# Patient Record
Sex: Female | Born: 1945 | Race: White | Hispanic: No | Marital: Married | Smoking: Current every day smoker
Health system: Southern US, Community
[De-identification: ages and names within clinical notes are randomized; demographics above are authoritative.]

## PROBLEM LIST (undated history)

## (undated) DIAGNOSIS — Z973 Presence of spectacles and contact lenses: Secondary | ICD-10-CM

## (undated) DIAGNOSIS — I10 Essential (primary) hypertension: Secondary | ICD-10-CM

## (undated) DIAGNOSIS — F419 Anxiety disorder, unspecified: Secondary | ICD-10-CM

## (undated) DIAGNOSIS — C801 Malignant (primary) neoplasm, unspecified: Secondary | ICD-10-CM

## (undated) DIAGNOSIS — K219 Gastro-esophageal reflux disease without esophagitis: Secondary | ICD-10-CM

## (undated) DIAGNOSIS — M1990 Unspecified osteoarthritis, unspecified site: Secondary | ICD-10-CM

## (undated) HISTORY — PX: COLONOSCOPY: SHX174

## (undated) HISTORY — PX: TUBAL LIGATION: SHX77

---
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
## 2002-04-09 ENCOUNTER — Other Ambulatory Visit: Admission: RE | Admit: 2002-04-09 | Discharge: 2002-04-09 | Payer: Self-pay | Admitting: Obstetrics and Gynecology

## 2002-04-16 ENCOUNTER — Ambulatory Visit (HOSPITAL_COMMUNITY): Admission: RE | Admit: 2002-04-16 | Discharge: 2002-04-16 | Payer: Self-pay

## 2002-04-16 ENCOUNTER — Encounter: Payer: Self-pay | Admitting: Obstetrics and Gynecology

## 2003-05-05 ENCOUNTER — Other Ambulatory Visit: Admission: RE | Admit: 2003-05-05 | Discharge: 2003-05-05 | Payer: Self-pay | Admitting: Obstetrics and Gynecology

## 2004-06-16 ENCOUNTER — Other Ambulatory Visit: Admission: RE | Admit: 2004-06-16 | Discharge: 2004-06-16 | Payer: Self-pay | Admitting: Obstetrics and Gynecology

## 2005-07-27 ENCOUNTER — Other Ambulatory Visit: Admission: RE | Admit: 2005-07-27 | Discharge: 2005-07-27 | Payer: Self-pay | Admitting: Obstetrics and Gynecology

## 2008-04-15 ENCOUNTER — Encounter: Admission: RE | Admit: 2008-04-15 | Discharge: 2008-04-15 | Payer: Self-pay | Admitting: Orthopedic Surgery

## 2008-10-03 HISTORY — PX: CYSTOSCOPY: SHX5120

## 2008-10-03 HISTORY — PX: CYSTOSCOPY WITH BIOPSY: SHX5122

## 2008-11-14 ENCOUNTER — Ambulatory Visit (HOSPITAL_BASED_OUTPATIENT_CLINIC_OR_DEPARTMENT_OTHER): Admission: RE | Admit: 2008-11-14 | Discharge: 2008-11-14 | Payer: Self-pay | Admitting: Urology

## 2008-11-14 ENCOUNTER — Encounter (INDEPENDENT_AMBULATORY_CARE_PROVIDER_SITE_OTHER): Payer: Self-pay | Admitting: Urology

## 2008-12-29 ENCOUNTER — Ambulatory Visit (HOSPITAL_COMMUNITY): Admission: RE | Admit: 2008-12-29 | Discharge: 2008-12-29 | Payer: Self-pay | Admitting: Obstetrics and Gynecology

## 2009-03-05 ENCOUNTER — Ambulatory Visit (HOSPITAL_COMMUNITY): Admission: RE | Admit: 2009-03-05 | Discharge: 2009-03-05 | Payer: Self-pay | Admitting: Obstetrics and Gynecology

## 2009-03-05 ENCOUNTER — Ambulatory Visit: Payer: Self-pay | Admitting: Vascular Surgery

## 2009-03-05 ENCOUNTER — Encounter (INDEPENDENT_AMBULATORY_CARE_PROVIDER_SITE_OTHER): Payer: Self-pay | Admitting: Obstetrics and Gynecology

## 2009-04-02 ENCOUNTER — Ambulatory Visit (HOSPITAL_BASED_OUTPATIENT_CLINIC_OR_DEPARTMENT_OTHER): Admission: RE | Admit: 2009-04-02 | Discharge: 2009-04-02 | Payer: Self-pay | Admitting: Urology

## 2009-04-02 ENCOUNTER — Encounter (INDEPENDENT_AMBULATORY_CARE_PROVIDER_SITE_OTHER): Payer: Self-pay | Admitting: Urology

## 2011-01-09 LAB — POCT I-STAT 4, (NA,K, GLUC, HGB,HCT)
Glucose, Bld: 116 mg/dL — ABNORMAL HIGH (ref 70–99)
HCT: 51 % — ABNORMAL HIGH (ref 36.0–46.0)
Hemoglobin: 17.3 g/dL — ABNORMAL HIGH (ref 12.0–15.0)
Potassium: 3.9 mEq/L (ref 3.5–5.1)
Sodium: 141 mEq/L (ref 135–145)

## 2011-01-18 LAB — POCT I-STAT 4, (NA,K, GLUC, HGB,HCT)
Glucose, Bld: 121 mg/dL — ABNORMAL HIGH (ref 70–99)
HCT: 48 % — ABNORMAL HIGH (ref 36.0–46.0)
Hemoglobin: 16.3 g/dL — ABNORMAL HIGH (ref 12.0–15.0)
Potassium: 3.8 mEq/L (ref 3.5–5.1)
Sodium: 143 mEq/L (ref 135–145)

## 2011-02-15 NOTE — Op Note (Signed)
NAMEFRANCIA, Tammy Montgomery                 ACCOUNT NO.:  [id number]   MEDICAL RECORD NO.:  [id number]          PATIENT TYPE:  AMB   LOCATION:  [facility]                         FACILITY:  [facility]   PHYSICIAN:  Sigmund I. Patsi Sears, M.D.DATE OF BIRTH:  06-Nov-1945   DATE OF PROCEDURE:  11/14/2008  DATE OF DISCHARGE:                               OPERATIVE REPORT   ASSISTANT:  Dr. Duane Boston.   PREOPERATIVE DIAGNOSIS:  Bladder tumors.   POSTOPERATIVE DIAGNOSIS:  Two Left lateral wall bladder tumors, primary  4cm, and satellite 1cm in size.   PROCEDURE:  1. Pan cystourethroscopy.  2. Transurethral resection of bladder tumor.  3. Fulguration of bleeding for resection.   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  She underwent evaluation and workup and was  found to have bladder tumors.  There is no evidence of metastatic  disease on CT scan, and no evidence of upper tract disease.  She is here  today electively for transurethral resection of bladder tumors.   PROCEDURE IN DETAIL:  The patient brought to the operating room, and  after successful induction of general LMA anesthetic, she was placed in  dorsolithotomy position, and all pressure points were padded  appropriately.  A preoperative time out was performed.  She received  preoperative antibiotics.   Using a cystoscope with 30- and 70-degree lenses, pan cystourethroscopy  was performed.  The patient's urethra appeared normal.  On entering the  patient's bladder, it was clearly evident that she had bladder tumors.  Along the left lateral wall, she had one 4-cm bladder tumor and a  satellite 1-cm bladder tumor.  These were located away from the ureteral  orifice.  Both ureteral orifices were seen effluxing clear urine.  The  bladder had evidence of wide-mouth diverticula and cellules.   Using the resectoscope, we resected the bladder tumor.  Resection was  carried down to the  detrusor fibers.  During this resection, a small  perforation could be seen along the lateral aspect of the left bladder.  All bleeding was fulgurated once the entirety of the resection was  completed.  The bladder was drained, and a Foley catheter was left in  place.   Mitomycin was held due to the small bladder perforation.   Please note Dr. Jethro Bolus was the responsible surgeon and  present throughout the entirety case.   ESTIMATED BLOOD LOSS:  Minimal.   URINE OUTPUT:  Unrecorded.   DRAINS:  Foley catheter which will stay in place until Monday, November 17, 2008.   COMPLICATIONS:  None apparent.   DISPOSITION:  The patient to PACU for further care.      Delman Kitten, MD      Sigmund I. Patsi Sears, M.D.  Electronically Signed    DW/MEDQ  D:  11/14/2008  T:  11/14/2008  Job:  [id number]

## 2011-02-15 NOTE — Op Note (Signed)
Tammy Montgomery, Tammy Montgomery                 ACCOUNT NO.:  [id number]   MEDICAL RECORD NO.:  [id number]          PATIENT TYPE:  AMB   LOCATION:  [facility]                         FACILITY:  [facility]   PHYSICIAN:  Sigmund I. Patsi Sears, M.D.DATE OF BIRTH:  1946-06-05   DATE OF PROCEDURE:  DATE OF DISCHARGE:                               OPERATIVE REPORT   PREOPERATIVE DIAGNOSIS:  Bladder cancer.   POSTOPERATIVE DIAGNOSIS:  Bladder cancer.   PROCEDURES PERFORMED:  1. Bladder biopsy.  2. Fulguration of bladder biopsy.  3. Cystourethroscopy.  4. Installation of mitomycin C.   SURGEON:  Sigmund I. Patsi Sears, M.D.   RESIDENT:  Magda Paganini.   ANESTHESIA:  General with laryngeal mask airway.   FINDINGS:  The patient had a small 0.5 papillary bladder lesion just  proximal to the left ureteral orifice.  This was removed in its entirety  with cold scope biopsy forceps.  The remainder of the bladder was normal  in appearance.   INDICATIONS FOR PROCEDURE:  [name] is a 65 year old female with a  history of TIG1 bladder cancer, who was found to have recurrent disease  in office cystoscopy.  She was recommended to undergo resection versus  biopsy with installation of mitomycin and she presents to the operating  room for that procedure today.   PROCEDURE IN DETAIL:  [name] was brought back to the operating room  after being given the risks, benefits, alternatives.  Time out was  called to confirm correct patient and procedure and site.  After  successful induction of general anesthesia, she was positioned in dorsal  lithotomy position and efforts were made to reduce any pressure on bony  prominences.  Bilateral spontaneous compression devices were in place.  IV antibiotic were administered.  Her perineum was prepped and draped in  the usual sterile fashion.  Surgeon was sterilely gowned and gloved.   A 22-French cystoscopic sheath was used to insert a 30 degree lens  transurethrally into the  bladder and pancystoscopy was performed.  This  revealed the bladder was normal in shape and opacity.  There was no  evidence of any abnormal stones or foreign bodies.  She did have a  trabeculated appearance of her bladder.  There was a single papillary  bladder lesion identified just proximal to left ureteral orifice.   This was biopsied and removed in its entirety using a cold cut biopsy  forceps.  The base of the lesion was then fulgurated using a Bugbee  electrode.  The patient's bladder was emptied and filled and the area  was hemostatic.   A 20-French Foley catheter was inserted transurethrally in the patient's  bladder.  The balloon was inflated with 10 mL of sterile water and  mitomycin C was instilled in the patient's bladder and the catheter was  capped off.  This will be removed in 1 hour in the PACU and she will  undergo a voiding trial at that time.   It was at that point that procedure was terminated.   Please note that Dr. Lynelle Smoke I. Patsi Sears was present, available and  participated in all aspects of this patient's operation.   DISPOSITION:  The patient tolerated the procedure well, was extubated  and transported to the PACU in stable condition.     ______________________________  Windell Moulding I. Patsi Sears, M.D.  Electronically Signed    KR/MEDQ  D:  04/02/2009  T:  04/02/2009  Job:  [id number]

## 2012-02-07 ENCOUNTER — Other Ambulatory Visit: Payer: Self-pay | Admitting: Obstetrics and Gynecology

## 2013-03-14 ENCOUNTER — Other Ambulatory Visit: Payer: Self-pay | Admitting: Obstetrics and Gynecology

## 2013-12-13 ENCOUNTER — Other Ambulatory Visit: Payer: Self-pay | Admitting: Orthopedic Surgery

## 2013-12-16 ENCOUNTER — Encounter (HOSPITAL_BASED_OUTPATIENT_CLINIC_OR_DEPARTMENT_OTHER): Payer: Self-pay | Admitting: *Deleted

## 2013-12-16 NOTE — Progress Notes (Signed)
Called for recent labs-fell and fx ribs-denies chest congestion-is a smoker Will need ekg

## 2013-12-17 ENCOUNTER — Encounter (HOSPITAL_BASED_OUTPATIENT_CLINIC_OR_DEPARTMENT_OTHER): Payer: Self-pay | Admitting: Certified Registered"

## 2013-12-17 ENCOUNTER — Ambulatory Visit (HOSPITAL_BASED_OUTPATIENT_CLINIC_OR_DEPARTMENT_OTHER): Payer: Medicare Other | Admitting: Certified Registered"

## 2013-12-17 ENCOUNTER — Encounter (HOSPITAL_BASED_OUTPATIENT_CLINIC_OR_DEPARTMENT_OTHER)
Admission: RE | Disposition: A | Discharge status: Home / Self Care | Payer: Self-pay | Source: Ambulatory Visit | Attending: Orthopedic Surgery

## 2013-12-17 ENCOUNTER — Ambulatory Visit (HOSPITAL_BASED_OUTPATIENT_CLINIC_OR_DEPARTMENT_OTHER)
Admission: RE | Admit: 2013-12-17 | Discharge: 2013-12-17 | Disposition: A | Discharge status: Home / Self Care | Payer: Medicare Other | Source: Ambulatory Visit | Attending: Orthopedic Surgery | Admitting: Orthopedic Surgery

## 2013-12-17 ENCOUNTER — Encounter (HOSPITAL_BASED_OUTPATIENT_CLINIC_OR_DEPARTMENT_OTHER): Payer: Medicare Other | Admitting: Certified Registered"

## 2013-12-17 DIAGNOSIS — W19XXXA Unspecified fall, initial encounter: Secondary | ICD-10-CM | POA: Insufficient documentation

## 2013-12-17 DIAGNOSIS — I10 Essential (primary) hypertension: Secondary | ICD-10-CM | POA: Insufficient documentation

## 2013-12-17 DIAGNOSIS — K219 Gastro-esophageal reflux disease without esophagitis: Secondary | ICD-10-CM | POA: Insufficient documentation

## 2013-12-17 DIAGNOSIS — IMO0002 Reserved for concepts with insufficient information to code with codable children: Secondary | ICD-10-CM | POA: Insufficient documentation

## 2013-12-17 DIAGNOSIS — F411 Generalized anxiety disorder: Secondary | ICD-10-CM | POA: Insufficient documentation

## 2013-12-17 DIAGNOSIS — Z8551 Personal history of malignant neoplasm of bladder: Secondary | ICD-10-CM | POA: Insufficient documentation

## 2013-12-17 DIAGNOSIS — M129 Arthropathy, unspecified: Secondary | ICD-10-CM | POA: Insufficient documentation

## 2013-12-17 DIAGNOSIS — F17200 Nicotine dependence, unspecified, uncomplicated: Secondary | ICD-10-CM | POA: Insufficient documentation

## 2013-12-17 HISTORY — DX: Anxiety disorder, unspecified: F41.9

## 2013-12-17 HISTORY — DX: Unspecified osteoarthritis, unspecified site: M19.90

## 2013-12-17 HISTORY — DX: Malignant (primary) neoplasm, unspecified: C80.1

## 2013-12-17 HISTORY — DX: Essential (primary) hypertension: I10

## 2013-12-17 HISTORY — DX: Gastro-esophageal reflux disease without esophagitis: K21.9

## 2013-12-17 HISTORY — DX: Presence of spectacles and contact lenses: Z97.3

## 2013-12-17 HISTORY — PX: OPEN REDUCTION INTERNAL FIXATION (ORIF) PROXIMAL PHALANX: SHX6235

## 2013-12-17 LAB — POCT I-STAT, CHEM 8
BUN: 23 mg/dL (ref 6–23)
CALCIUM ION: 1.1 mmol/L — AB (ref 1.13–1.30)
CREATININE: 0.8 mg/dL (ref 0.50–1.10)
Chloride: 111 mEq/L (ref 96–112)
Glucose, Bld: 90 mg/dL (ref 70–99)
HCT: 43 % (ref 36.0–46.0)
Hemoglobin: 14.6 g/dL (ref 12.0–15.0)
Potassium: 5.7 mEq/L — ABNORMAL HIGH (ref 3.7–5.3)
Sodium: 137 mEq/L (ref 137–147)
TCO2: 29 mmol/L (ref 0–100)

## 2013-12-17 SURGERY — OPEN REDUCTION INTERNAL FIXATION (ORIF) PROXIMAL PHALANX
Anesthesia: General | Site: Finger | Laterality: Left

## 2013-12-17 MED ORDER — OXYCODONE HCL 5 MG/5ML PO SOLN: 5.0000 mg | Freq: Once | ORAL | Status: AC | PRN

## 2013-12-17 MED ORDER — EPHEDRINE SULFATE 50 MG/ML IJ SOLN
INTRAMUSCULAR | Status: DC | PRN
  Administered 2013-12-17: 10 mg via INTRAVENOUS

## 2013-12-17 MED ORDER — BUPIVACAINE HCL (PF) 0.25 % IJ SOLN
INTRAMUSCULAR | Status: DC | PRN
  Administered 2013-12-17: 5 mL

## 2013-12-17 MED ORDER — MORPHINE SULFATE 2 MG/ML IJ SOLN
1.0000 mg | INTRAMUSCULAR | Status: DC | PRN
  Administered 2013-12-17: 1 mg via INTRAVENOUS

## 2013-12-17 MED ORDER — MIDAZOLAM HCL 2 MG/2ML IJ SOLN
INTRAMUSCULAR | Status: AC
  Filled 2013-12-17: qty 2

## 2013-12-17 MED ORDER — MORPHINE SULFATE 2 MG/ML IJ SOLN
INTRAMUSCULAR | Status: AC
  Filled 2013-12-17: qty 1

## 2013-12-17 MED ORDER — HYDROMORPHONE HCL PF 1 MG/ML IJ SOLN
INTRAMUSCULAR | Status: AC
  Filled 2013-12-17: qty 1

## 2013-12-17 MED ORDER — LIDOCAINE HCL (CARDIAC) 20 MG/ML IV SOLN
INTRAVENOUS | Status: DC | PRN
  Administered 2013-12-17: 40 mg via INTRAVENOUS

## 2013-12-17 MED ORDER — MIDAZOLAM HCL 2 MG/2ML IJ SOLN: 1.0000 mg | INTRAMUSCULAR | Status: DC | PRN

## 2013-12-17 MED ORDER — GLYCOPYRROLATE 0.2 MG/ML IJ SOLN
INTRAMUSCULAR | Status: DC | PRN
  Administered 2013-12-17: 0.2 mg via INTRAVENOUS

## 2013-12-17 MED ORDER — OXYCODONE-ACETAMINOPHEN 5-325 MG PO TABS: ORAL_TABLET | ORAL | Status: DC

## 2013-12-17 MED ORDER — CHLORHEXIDINE GLUCONATE 4 % EX LIQD: 60.0000 mL | Freq: Once | CUTANEOUS | Status: DC

## 2013-12-17 MED ORDER — CEFAZOLIN SODIUM-DEXTROSE 2-3 GM-% IV SOLR: 2.0000 g | INTRAVENOUS | Status: DC

## 2013-12-17 MED ORDER — OXYCODONE HCL 5 MG PO TABS
ORAL_TABLET | ORAL | Status: AC
  Filled 2013-12-17: qty 1

## 2013-12-17 MED ORDER — LACTATED RINGERS IV SOLN: INTRAVENOUS | Status: DC

## 2013-12-17 MED ORDER — CEFAZOLIN SODIUM-DEXTROSE 2-3 GM-% IV SOLR
INTRAVENOUS | Status: DC | PRN
  Administered 2013-12-17: 2 g via INTRAVENOUS

## 2013-12-17 MED ORDER — FENTANYL CITRATE 0.05 MG/ML IJ SOLN
INTRAMUSCULAR | Status: DC | PRN
  Administered 2013-12-17 (×2): 25 ug via INTRAVENOUS
  Administered 2013-12-17: 50 ug via INTRAVENOUS

## 2013-12-17 MED ORDER — PROPOFOL 10 MG/ML IV BOLUS
INTRAVENOUS | Status: DC | PRN
  Administered 2013-12-17: 120 mg via INTRAVENOUS

## 2013-12-17 MED ORDER — OXYCODONE HCL 5 MG PO TABS
5.0000 mg | ORAL_TABLET | Freq: Once | ORAL | Status: AC | PRN
  Administered 2013-12-17: 5 mg via ORAL

## 2013-12-17 MED ORDER — MIDAZOLAM HCL 5 MG/5ML IJ SOLN
INTRAMUSCULAR | Status: DC | PRN
  Administered 2013-12-17: 1 mg via INTRAVENOUS

## 2013-12-17 MED ORDER — FENTANYL CITRATE 0.05 MG/ML IJ SOLN: 50.0000 ug | INTRAMUSCULAR | Status: DC | PRN

## 2013-12-17 MED ORDER — FENTANYL CITRATE 0.05 MG/ML IJ SOLN
INTRAMUSCULAR | Status: AC
  Filled 2013-12-17: qty 6

## 2013-12-17 MED ORDER — LACTATED RINGERS IV SOLN
INTRAVENOUS | Status: DC | PRN
  Administered 2013-12-17 (×2): via INTRAVENOUS

## 2013-12-17 MED ORDER — DEXAMETHASONE SODIUM PHOSPHATE 10 MG/ML IJ SOLN
INTRAMUSCULAR | Status: DC | PRN
  Administered 2013-12-17: 10 mg via INTRAVENOUS

## 2013-12-17 MED ORDER — CEFAZOLIN SODIUM-DEXTROSE 2-3 GM-% IV SOLR
INTRAVENOUS | Status: AC
  Filled 2013-12-17: qty 50

## 2013-12-17 MED ORDER — PROMETHAZINE HCL 25 MG/ML IJ SOLN: 6.2500 mg | INTRAMUSCULAR | Status: DC | PRN

## 2013-12-17 MED ORDER — ONDANSETRON HCL 4 MG/2ML IJ SOLN
INTRAMUSCULAR | Status: DC | PRN
  Administered 2013-12-17: 4 mg via INTRAVENOUS

## 2013-12-17 SURGICAL SUPPLY — 59 items
BANDAGE ELASTIC 3 VELCRO ST LF (GAUZE/BANDAGES/DRESSINGS) IMPLANT
BLADE MINI RND TIP GREEN BEAV (BLADE) IMPLANT
BLADE SURG 15 STRL LF DISP TIS (BLADE) ×2 IMPLANT
BLADE SURG 15 STRL SS (BLADE) ×3
BNDG CMPR 9X4 STRL LF SNTH (GAUZE/BANDAGES/DRESSINGS) ×1
BNDG ESMARK 4X9 LF (GAUZE/BANDAGES/DRESSINGS) ×2 IMPLANT
BNDG GAUZE ELAST 4 BULKY (GAUZE/BANDAGES/DRESSINGS) ×1 IMPLANT
CHLORAPREP W/TINT 26ML (MISCELLANEOUS) ×3 IMPLANT
CORDS BIPOLAR (ELECTRODE) ×1 IMPLANT
COVER MAYO STAND STRL (DRAPES) ×3 IMPLANT
COVER TABLE BACK 60X90 (DRAPES) ×3 IMPLANT
CUFF TOURNIQUET SINGLE 18IN (TOURNIQUET CUFF) ×3 IMPLANT
DRAPE EXTREMITY T 121X128X90 (DRAPE) ×3 IMPLANT
DRAPE OEC MINIVIEW 54X84 (DRAPES) ×2 IMPLANT
DRAPE SURG 17X23 STRL (DRAPES) ×3 IMPLANT
DRIVER HEX 1.5MM (INSTRUMENTS) ×2 IMPLANT
GAUZE XEROFORM 1X8 LF (GAUZE/BANDAGES/DRESSINGS) ×3 IMPLANT
GLOVE BIO SURGEON STRL SZ7.5 (GLOVE) ×3 IMPLANT
GLOVE BIOGEL PI IND STRL 7.0 (GLOVE) IMPLANT
GLOVE BIOGEL PI IND STRL 8 (GLOVE) ×1 IMPLANT
GLOVE BIOGEL PI IND STRL 8.5 (GLOVE) IMPLANT
GLOVE BIOGEL PI INDICATOR 7.0 (GLOVE) ×4
GLOVE BIOGEL PI INDICATOR 8 (GLOVE) ×2
GLOVE BIOGEL PI INDICATOR 8.5 (GLOVE) ×2
GLOVE ECLIPSE 6.5 STRL STRAW (GLOVE) ×4 IMPLANT
GLOVE EXAM NITRILE MD LF STRL (GLOVE) ×2 IMPLANT
GLOVE SURG ORTHO 8.0 STRL STRW (GLOVE) ×2 IMPLANT
GOWN STRL REUS W/ TWL LRG LVL3 (GOWN DISPOSABLE) ×1 IMPLANT
GOWN STRL REUS W/TWL LRG LVL3 (GOWN DISPOSABLE) ×6
GOWN STRL REUS W/TWL XL LVL3 (GOWN DISPOSABLE) ×7 IMPLANT
K-WIRE .035X4 (WIRE) ×2 IMPLANT
K-WIRE .045X4 (WIRE) ×4 IMPLANT
NDL HYPO 25X1 1.5 SAFETY (NEEDLE) IMPLANT
NEEDLE HYPO 25X1 1.5 SAFETY (NEEDLE) ×3 IMPLANT
NS IRRIG 1000ML POUR BTL (IV SOLUTION) ×3 IMPLANT
PACK BASIN DAY SURGERY FS (CUSTOM PROCEDURE TRAY) ×3 IMPLANT
PAD CAST 3X4 CTTN HI CHSV (CAST SUPPLIES) IMPLANT
PAD CAST 4YDX4 CTTN HI CHSV (CAST SUPPLIES) IMPLANT
PADDING CAST ABS 4INX4YD NS (CAST SUPPLIES) ×2
PADDING CAST ABS COTTON 4X4 ST (CAST SUPPLIES) ×1 IMPLANT
PADDING CAST COTTON 3X4 STRL (CAST SUPPLIES)
PADDING CAST COTTON 4X4 STRL (CAST SUPPLIES)
SLEEVE SCD COMPRESS KNEE MED (MISCELLANEOUS) ×2 IMPLANT
SPLINT PLASTER CAST XFAST 3X15 (CAST SUPPLIES) IMPLANT
SPLINT PLASTER XTRA FASTSET 3X (CAST SUPPLIES) ×20
SPONGE GAUZE 4X4 12PLY (GAUZE/BANDAGES/DRESSINGS) ×3 IMPLANT
STOCKINETTE 4X48 STRL (DRAPES) ×3 IMPLANT
SUCTION FRAZIER TIP 10 FR DISP (SUCTIONS) ×2 IMPLANT
SUT ETHILON 3 0 PS 1 (SUTURE) IMPLANT
SUT ETHILON 4 0 PS 2 18 (SUTURE) ×3 IMPLANT
SUT MERSILENE 4 0 P 3 (SUTURE) IMPLANT
SUT VIC AB 3-0 PS1 18 (SUTURE)
SUT VIC AB 3-0 PS1 18XBRD (SUTURE) IMPLANT
SUT VICRYL 4-0 PS2 18IN ABS (SUTURE) IMPLANT
SYR BULB 3OZ (MISCELLANEOUS) ×3 IMPLANT
SYR CONTROL 10ML LL (SYRINGE) ×2 IMPLANT
TOWEL OR 17X24 6PK STRL BLUE (TOWEL DISPOSABLE) ×6 IMPLANT
UNDERPAD 30X30 INCONTINENT (UNDERPADS AND DIAPERS) ×3 IMPLANT
VIRAK ORTHO ×4 IMPLANT

## 2013-12-17 NOTE — Discharge Instructions (Addendum)

## 2013-12-17 NOTE — Brief Op Note (Signed)
12/17/2013  4:05 PM  PATIENT:  Rennis Chris  68 y.o. female  PRE-OPERATIVE DIAGNOSIS:  left small middle finger phalanx fracture  POST-OPERATIVE DIAGNOSIS:  left small middle finger phalanx fracture  PROCEDURE:  Procedure(s): EXTERNAL FIXATION   LEFT SMALL FINGER  (Left)  SURGEON:  Surgeon(s) and Role:    * Tennis Must, MD - Primary    * Wynonia Sours, MD - Assisting  PHYSICIAN ASSISTANT:   ASSISTANTS: Daryll Brod, MD   ANESTHESIA:   general  EBL:  Total I/O In: 1000 [I.V.:1000] Out: -   BLOOD ADMINISTERED:none  DRAINS: none   LOCAL MEDICATIONS USED:  MARCAINE     SPECIMEN:  No Specimen  DISPOSITION OF SPECIMEN:  N/A  COUNTS:  YES  TOURNIQUET:  none  DICTATION: .Other Dictation: Dictation Number [id number]  PLAN OF CARE: Discharge to home after PACU  PATIENT DISPOSITION:  PACU - hemodynamically stable.

## 2013-12-17 NOTE — Anesthesia Postprocedure Evaluation (Signed)
  Anesthesia Post-op Note  Patient: Tammy Montgomery  Procedure(s) Performed: Procedure(s): EXTERNAL FIXATION   LEFT SMALL FINGER  (Left)  Patient Location: PACU  Anesthesia Type:General  Level of Consciousness: awake, alert , oriented and patient cooperative  Airway and Oxygen Therapy: Patient Spontanous Breathing  Post-op Pain: none  Post-op Assessment: Post-op Vital signs reviewed, Patient's Cardiovascular Status Stable, Respiratory Function Stable, Patent Airway, No signs of Nausea or vomiting and Pain level controlled  Post-op Vital Signs: Reviewed and stable  Complications: No apparent anesthesia complications

## 2013-12-17 NOTE — Transfer of Care (Signed)
Immediate Anesthesia Transfer of Care Note  Patient: Tammy Montgomery  Procedure(s) Performed: Procedure(s): EXTERNAL FIXATION   LEFT SMALL FINGER  (Left)  Patient Location: PACU  Anesthesia Type:General  Level of Consciousness: awake, sedated and patient cooperative  Airway & Oxygen Therapy: Patient Spontanous Breathing and Patient connected to face mask oxygen  Post-op Assessment: Report given to PACU RN and Post -op Vital signs reviewed and stable  Post vital signs: Reviewed and stable  Complications: No apparent anesthesia complications

## 2013-12-17 NOTE — Anesthesia Preprocedure Evaluation (Addendum)
Anesthesia Evaluation  Patient identified by MRN, date of birth, ID band Patient awake    Reviewed: Allergy & Precautions, H&P , NPO status , Patient's Chart, lab work & pertinent test results  History of Anesthesia Complications Negative for: history of anesthetic complications  Airway Mallampati: I  Neck ROM: Full    Dental  (+) Teeth Intact, Caps, Dental Advisory Given   Pulmonary Current Smoker,  breath sounds clear to auscultation        Cardiovascular hypertension, Rhythm:Regular Rate:Normal     Neuro/Psych    GI/Hepatic GERD-  ,  Endo/Other    Renal/GU      Musculoskeletal   Abdominal   Peds  Hematology   Anesthesia Other Findings   Reproductive/Obstetrics                          Anesthesia Physical Anesthesia Plan  ASA: II  Anesthesia Plan: General   Post-op Pain Management:    Induction: Intravenous  Airway Management Planned: LMA  Additional Equipment:   Intra-op Plan:   Post-operative Plan: Extubation in OR  Informed Consent: I have reviewed the patients History and Physical, chart, labs and discussed the procedure including the risks, benefits and alternatives for the proposed anesthesia with the patient or authorized representative who has indicated his/her understanding and acceptance.     Plan Discussed with: CRNA and Surgeon  Anesthesia Plan Comments:        Anesthesia Quick Evaluation

## 2013-12-17 NOTE — Op Note (Signed)
[id number] 

## 2013-12-17 NOTE — H&P (Signed)
  Tammy Montgomery is an 68 y.o. female.   Chief Complaint: left small finger fracture HPI: 68 yo rhd female states she fell on 12/05/13 injuring left small finger.  Seen at [facility] where XR revealed left small finger fracture.  Followed up with orthopaedics and referred for further care.  Reports no previous injury to finger and no other injury at this time.  Past Medical History  Diagnosis Date  . Hypertension   . Cancer     bladder  . GERD (gastroesophageal reflux disease)   . Anxiety   . Wears glasses   . Arthritis     Past Surgical History  Procedure Laterality Date  . Cystoscopy with biopsy  2010  . Cystoscopy  2010    bladder tumor removal  . Appendectomy  1963  . Tubal ligation    . Colonoscopy      x3    History reviewed. No pertinent family history. Social History:  reports that she has been smoking.  She does not have any smokeless tobacco history on file. She reports that she drinks alcohol. She reports that she does not use illicit drugs.  Allergies:  Allergies  Allergen Reactions  . Wellbutrin [Bupropion] Hives    No prescriptions prior to admission    No results found for this or any previous visit (from the past 40 hour(s)).  No results found.   A comprehensive review of systems was negative except for: Eyes: positive for contacts/glasses Ears, nose, mouth, throat, and face: positive for tinnitus Hematologic/lymphatic: positive for easy bruising Neurological: positive for gait problems Behavioral/Psych: positive for anxiety  Height 5\' 6"  (1.676 m), weight 81.647 kg (180 lb).  General appearance: alert, cooperative and appears stated age Head: Normocephalic, without obvious abnormality, atraumatic Neck: supple, symmetrical, trachea midline Resp: clear to auscultation bilaterally Cardio: regular rate and rhythm GI: non tender Extremities: intact sensation and capillary refill all digits. +epl/fpl/io. Pulses: 2+ and symmetric Skin: Skin color,  texture, turgor normal. No rashes or lesions Neurologic: Grossly normal Incision/Wound: none  Assessment/Plan Left small finger middle phalanx fracture.  Non operative and operative treatment options were discussed with the patient and patient wishes to proceed with operative treatment. Risks, benefits, and alternatives of surgery were discussed and the patient agrees with the plan of care.   Kayhan Boardley R 12/17/2013, 8:37 AM

## 2013-12-17 NOTE — Anesthesia Procedure Notes (Signed)
Procedure Name: LMA Insertion Date/Time: 12/17/2013 3:10 PM Performed by: Baxter Flattery Pre-anesthesia Checklist: Patient identified, Emergency Drugs available, Suction available and Patient being monitored Patient Re-evaluated:Patient Re-evaluated prior to inductionOxygen Delivery Method: Circle System Utilized Preoxygenation: Pre-oxygenation with 100% oxygen Intubation Type: IV induction Ventilation: Mask ventilation without difficulty LMA: LMA inserted LMA Size: 4.0 Number of attempts: 1 Airway Equipment and Method: bite block Placement Confirmation: positive ETCO2 and breath sounds checked- equal and bilateral Tube secured with: Tape Dental Injury: Teeth and Oropharynx as per pre-operative assessment

## 2013-12-18 NOTE — Op Note (Signed)
Tammy Montgomery, Tammy Montgomery                 ACCOUNT NO.:  [id number]  MEDICAL RECORD NO.:  [id number]  LOCATION:                                 FACILITY:  PHYSICIAN:  Leanora Cover, MD             DATE OF BIRTH:  DATE OF PROCEDURE:  12/17/2013 DATE OF DISCHARGE:                              OPERATIVE REPORT   PREOPERATIVE DIAGNOSIS:  Left small finger middle phalanx intra- articular base fracture.  POSTOPERATIVE DIAGNOSIS:  Left small finger middle phalanx intra- articular base fracture.  PROCEDURE:  Closed reduction and external fixation left small finger intra-articular base fracture.  SURGEON:  Leanora Cover, MD  ASSISTANT:  Daryll Brod, MD.  ANESTHESIA:  General.  IV FLUIDS:  Per anesthesia flow sheet.  ESTIMATED BLOOD LOSS:  Minimal.  COMPLICATIONS:  None.  SPECIMENS:  None.  TOURNIQUET:  None.  DISPOSITION:  Stable to PACU.  INDICATIONS:  Ms. Bourn is a 68 year old female who approximately 2 weeks ago fell injuring her left hand.  She was seen at [facility] and radiographs were taken revealing a small finger fracture.  She was referred to orthopedics and then referred to me for further care. She reports no previous injuries to the finger.  Radiographs revealed a intra-articular fracture of the base of the small finger, middle phalanx.  We discussed the nature of the injury.  We discussed operative and nonoperative treatment options.  Risks, benefits, and alternatives of surgery were discussed including the risk of blood loss, infection, damage to nerves, vessels, tendons, ligaments, bone; failure of surgery; need for additional surgery, complications with wound healing, continued pain, nonunion, malunion, stiffness.  She voiced understanding of these risks and elected to proceed.  OPERATIVE COURSE:  After being identified preoperatively by myself, the patient and I agreed upon the procedure and site of procedure.  The surgical site was marked.  The risks,  benefits, and alternatives of surgery were reviewed and she wished to proceed.  Surgical consent had been signed.  She was given IV Ancef as preoperative antibiotic prophylaxis.  She was transferred to the operating room and placed on the operating room table in supine position with the right upper extremity on an armboard.  General anesthesia was induced by anesthesiologist.  Left upper extremity was prepped and draped in normal sterile orthopedic fashion.  A surgical pause was performed between the surgeons, anesthesia, and operating staff, and all were in agreement as to the patient, procedure, and site of procedure.  A tourniquet was at the proximal aspect of the extremity, but never inflated.  C-arm was used in AP, lateral, and oblique projections throughout the case.  A closed reduction was performed of the small finger middle phalanx intra-articular base fracture.  The Digifix external fixator bars were taken from the set.  A 0.045-inch K-wire was advanced across the proximal phalanx through the axis of rotation of the PIP joint.  This was checked in AP and lateral projections and good position had been obtained.  The guide block was used to advance an additional 0.045-inch K-wire through the middle phalanx distal to the fracture site.  The guide block  was removed and the external fixator bars placed over the pins.  An additional 0.035-inch K-wire was then advanced through the next most distal hole across the middle phalanx.  The external fixator was provisionally tightened down.  The C-arm was used in AP and lateral projections to ensure appropriate reduction and position of hardware. The reduction was adjusted as necessary.  Good articular reduction had been obtained.  The external fixator bars were then placed into a slight distraction.  The C-arm was used in AP, lateral, and oblique projections to ensure appropriate reduction and position of hardware which was the case.  The  set screws were tightened.  The pins were bent and cut short as appropriate.  Xeroform was placed around the pin sites.  The area was dressed with sterile 4x4s and wrapped with a Kerlix bandage.  A volar slab splint including the long, ring, and small fingers was placed with the MPs flexed and the IPs extended.  This was wrapped with Kerlix and Ace bandage.  The tourniquet had never been inflated.  It was noted prior to placing the splint that range of motion could be obtained at the PIP joint without displacement of the fracture fragments.  The operative drapes were broken down.  The patient was awoken from anesthesia safely.  She was transferred back to the stretcher and taken to PACU in stable condition.  I will see her back in the office in 1 week for postoperative followup.  We will give her Percocet 5/325, 1-2 p.o. q.6 hours p.r.n. pain, dispensed #40.     Leanora Cover, MD     KK/MEDQ  D:  12/17/2013  T:  12/18/2013  Job:  [id number]

## 2013-12-19 ENCOUNTER — Encounter (HOSPITAL_BASED_OUTPATIENT_CLINIC_OR_DEPARTMENT_OTHER): Payer: Self-pay | Admitting: Orthopedic Surgery

## 2015-04-28 ENCOUNTER — Other Ambulatory Visit: Payer: Self-pay | Admitting: Obstetrics and Gynecology

## 2015-04-29 LAB — CYTOLOGY - PAP

## 2020-12-24 ENCOUNTER — Other Ambulatory Visit: Payer: Self-pay | Admitting: Obstetrics and Gynecology

## 2020-12-24 DIAGNOSIS — R928 Other abnormal and inconclusive findings on diagnostic imaging of breast: Secondary | ICD-10-CM

## 2021-01-13 ENCOUNTER — Other Ambulatory Visit: Payer: Self-pay

## 2021-01-13 ENCOUNTER — Ambulatory Visit
Admission: RE | Admit: 2021-01-13 | Discharge: 2021-01-13 | Disposition: A | Discharge status: Home / Self Care | Payer: Medicare Other | Source: Ambulatory Visit | Attending: Obstetrics and Gynecology | Admitting: Obstetrics and Gynecology

## 2021-01-13 ENCOUNTER — Other Ambulatory Visit: Payer: Self-pay | Admitting: Obstetrics and Gynecology

## 2021-01-13 DIAGNOSIS — R928 Other abnormal and inconclusive findings on diagnostic imaging of breast: Secondary | ICD-10-CM

## 2021-01-13 DIAGNOSIS — R921 Mammographic calcification found on diagnostic imaging of breast: Secondary | ICD-10-CM

## 2021-02-01 ENCOUNTER — Other Ambulatory Visit: Payer: Self-pay | Admitting: Diagnostic Radiology

## 2021-02-01 ENCOUNTER — Ambulatory Visit
Admission: RE | Admit: 2021-02-01 | Discharge: 2021-02-01 | Disposition: A | Discharge status: Home / Self Care | Payer: Medicare Other | Source: Ambulatory Visit | Attending: Obstetrics and Gynecology | Admitting: Obstetrics and Gynecology

## 2021-02-01 ENCOUNTER — Other Ambulatory Visit: Payer: Self-pay | Admitting: Obstetrics and Gynecology

## 2021-02-01 ENCOUNTER — Other Ambulatory Visit: Payer: Self-pay

## 2021-02-01 DIAGNOSIS — R921 Mammographic calcification found on diagnostic imaging of breast: Secondary | ICD-10-CM

## 2021-12-29 IMAGING — MG MM BREAST BX W/ LOC DEV 1ST LESION IMAGE BX SPEC STEREO GUIDE*R*
8 of 10 series · 8 of 22 positions shown · non-contrast
Comparison: Previous exams.
COMPARISON: Previous exams.

Addendum:
CLINICAL DATA: 75-year-old female with indeterminate right breast
calcifications.

EXAM:
RIGHT BREAST STEREOTACTIC CORE NEEDLE BIOPSY

[L (1 of 5)]
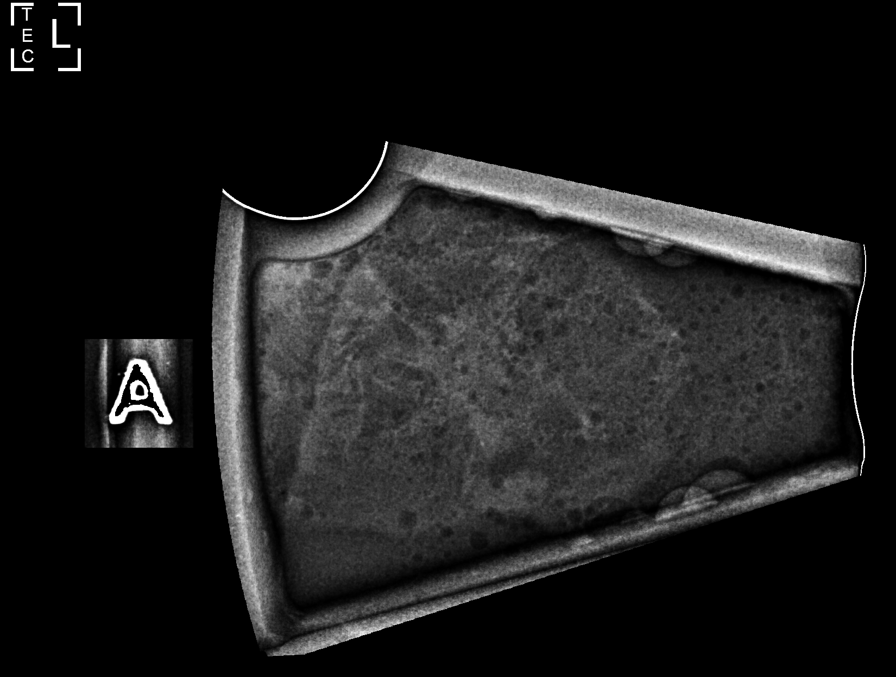

[L (2 of 5)]
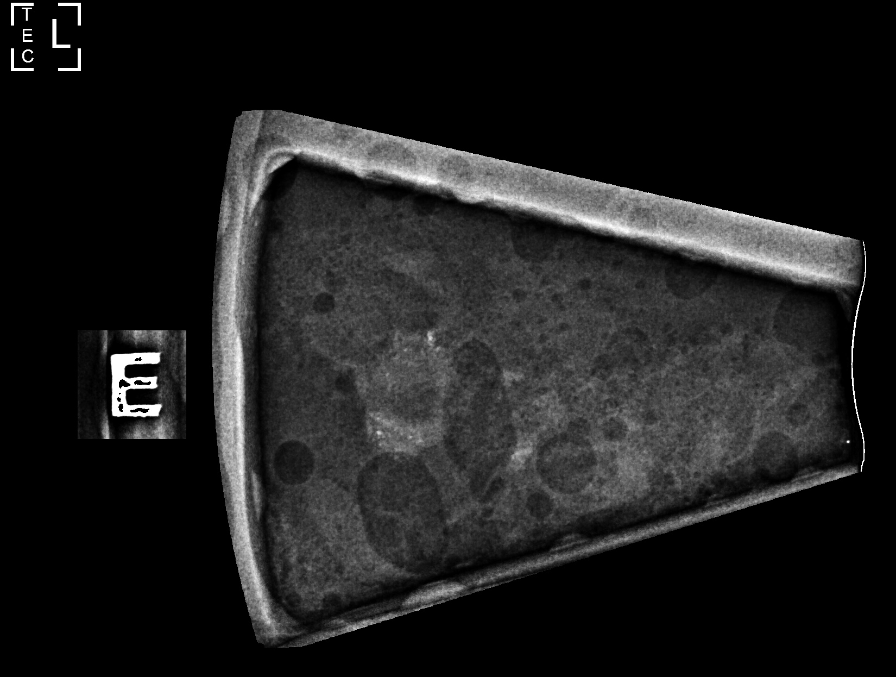

[L (3 of 5)]
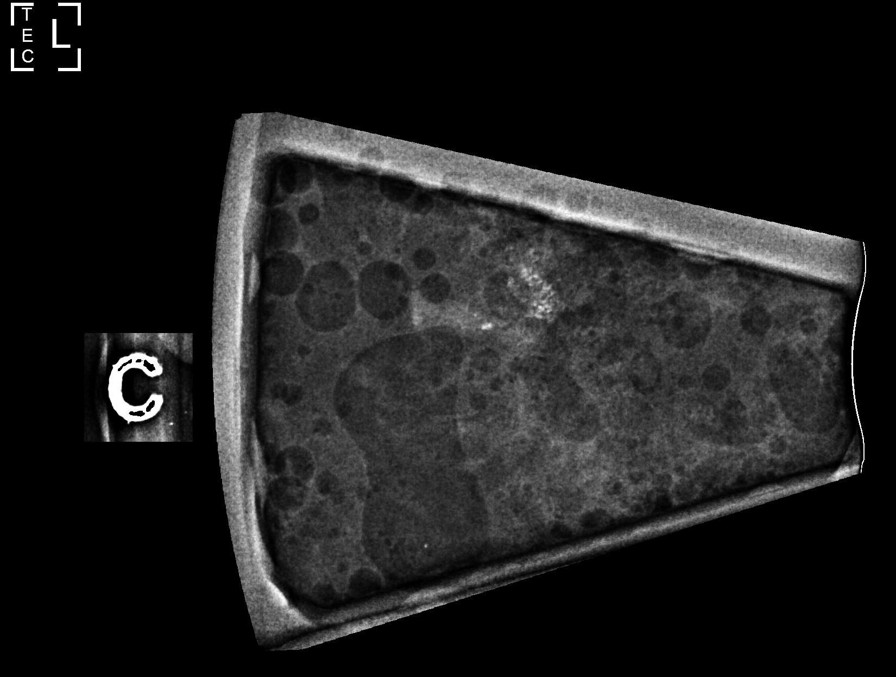

[L (4 of 5)]
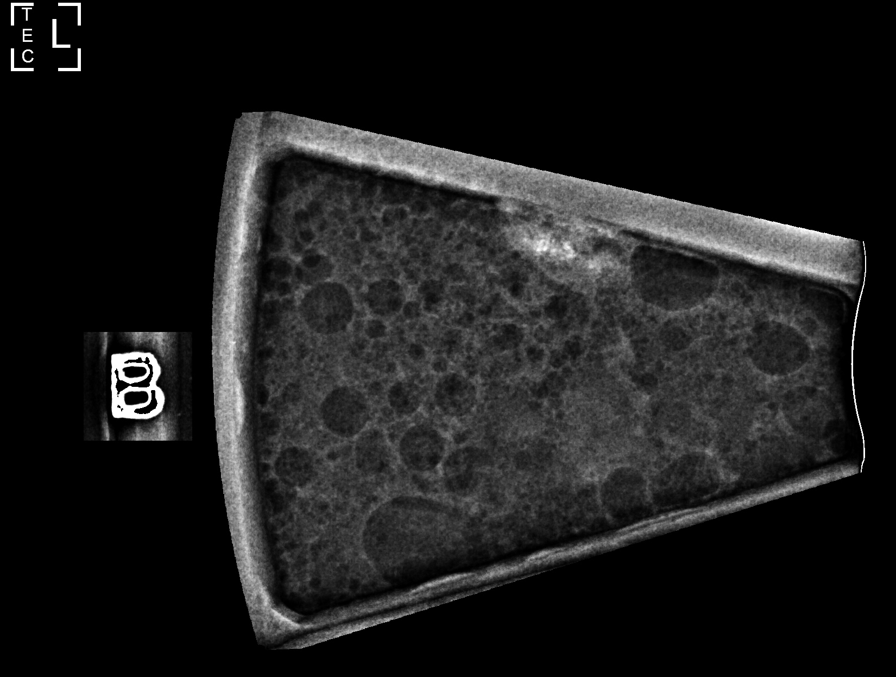

[L (5 of 5)]
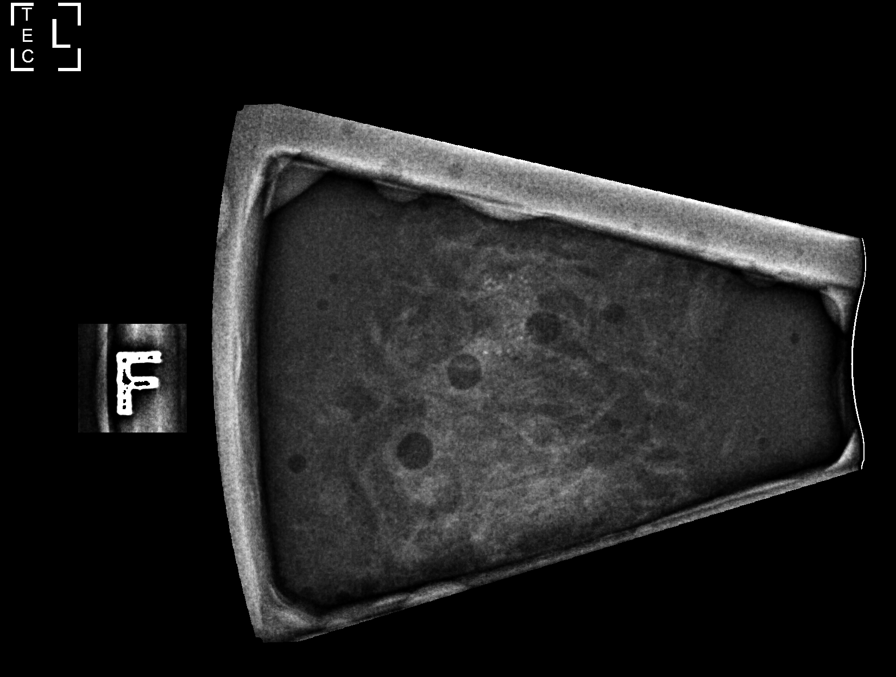

[R CC (1 of 2)]
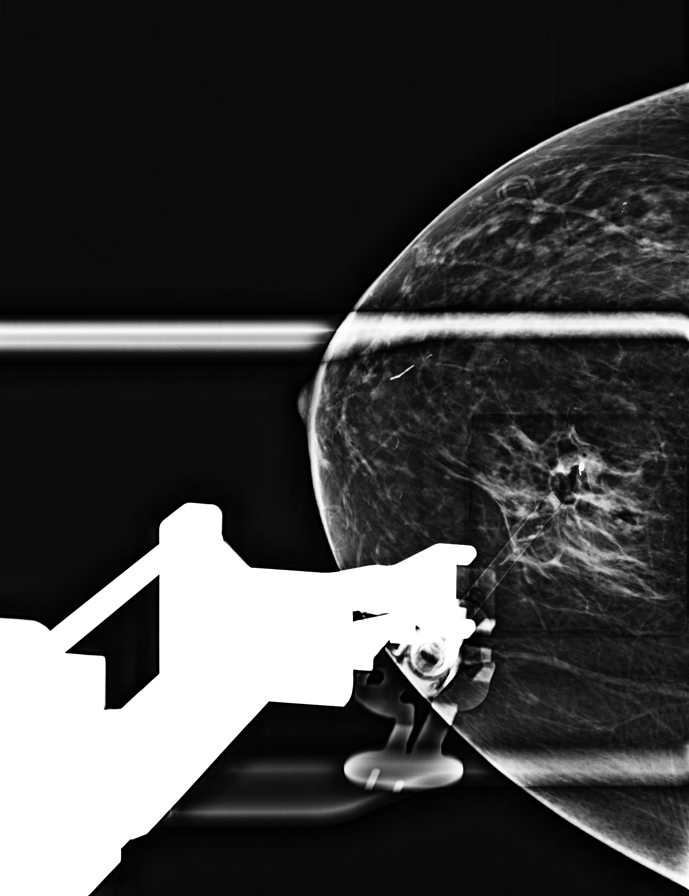

[R CC (2 of 2)]
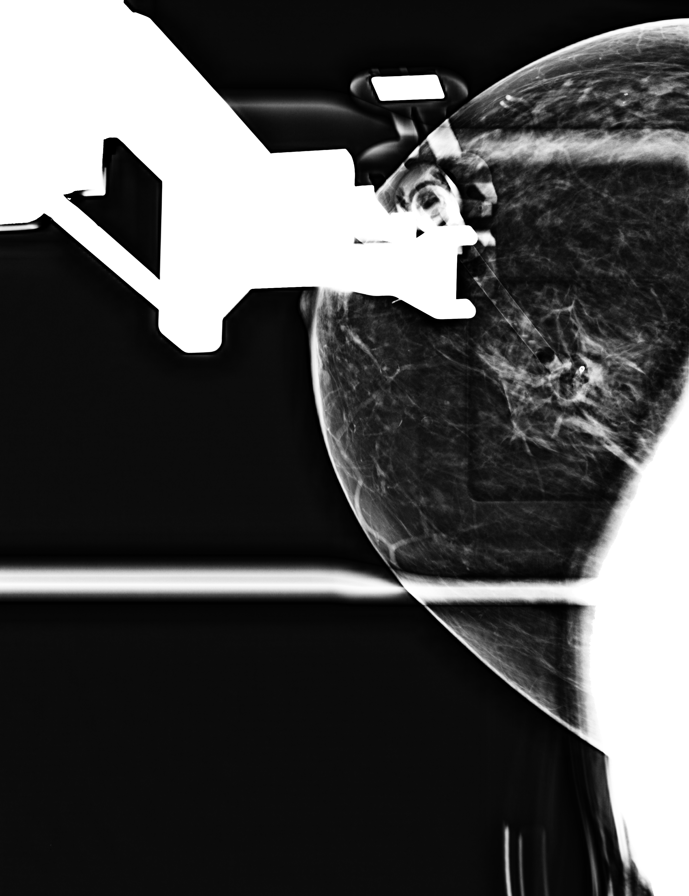

[R CC tomo · tomo slice 21/40.0]
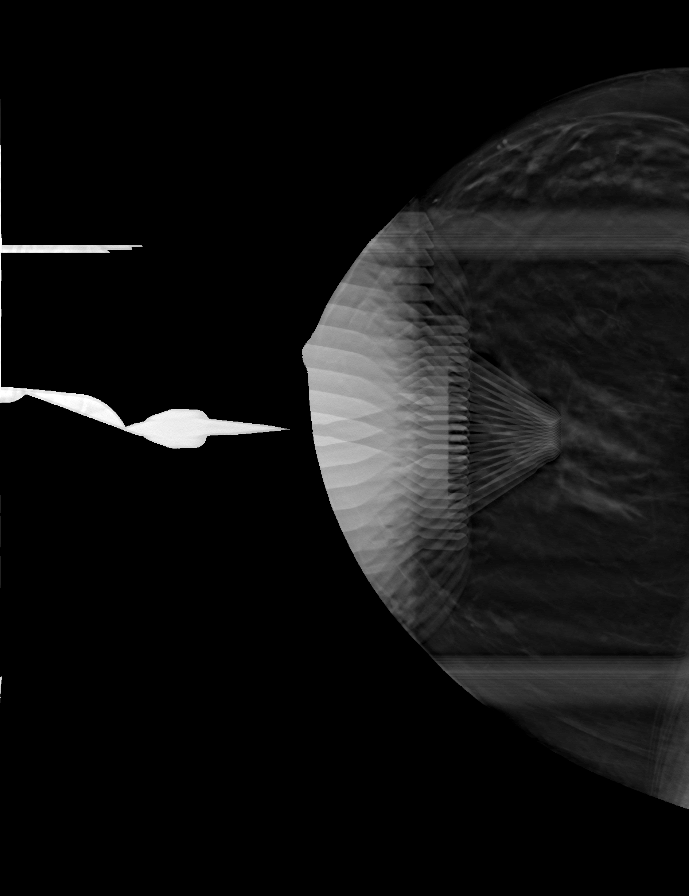

[8 of 22 positions shown; findings below may reference images not displayed]



Using sterile technique and 1% Lidocaine as local anesthetic, under
stereotactic guidance, a 9 gauge vacuum assisted device was used to
perform core needle biopsy of calcifications in the central right
breast using a superior approach. Specimen radiograph was performed
showing calcifications in multiple specimens. Specimens with
calcifications are identified for pathology.

Lesion quadrant: Lower inner quadrant

At the conclusion of the procedure, a coil shaped tissue marker clip
was deployed into the biopsy cavity. Follow-up 2-view mammogram was
performed and dictated separately.
IMPRESSION: Stereotactic-guided biopsy of the right breast. No apparent
complications.

ADDENDUM:
Pathology revealed FIBROCYSTIC CHANGE AND USUAL DUCTAL HYPERPLASIA
WITH CALCIFICATIONS of the Right breast, central. This was found to
be concordant by Dr. Elhachimi Dhiba.

Pathology results were discussed with the patient by telephone. The
patient reported doing well after the biopsy with tenderness at the
site. Post biopsy instructions and care were reviewed and questions
were answered. The patient was encouraged to call The [REDACTED]

The patient was instructed to return for annual screening

Pathology results reported by Idan Time, RN on 02/02/2021.



Using sterile technique and 1% Lidocaine as local anesthetic, under
stereotactic guidance, a 9 gauge vacuum assisted device was used to
perform core needle biopsy of calcifications in the central right
breast using a superior approach. Specimen radiograph was performed
showing calcifications in multiple specimens. Specimens with
calcifications are identified for pathology.

Lesion quadrant: Lower inner quadrant

At the conclusion of the procedure, a coil shaped tissue marker clip
was deployed into the biopsy cavity. Follow-up 2-view mammogram was
performed and dictated separately.
IMPRESSION: Stereotactic-guided biopsy of the right breast. No apparent
complications.

## 2021-12-29 IMAGING — MG MM BREAST LOCALIZATION CLIP
4 series · 4 of 12 positions shown · non-contrast
Comparison: Previous exam(s).

CLINICAL DATA: 75-year-old female status post stereotactic biopsy
of the right breast.

EXAM:
DIAGNOSTIC RIGHT MAMMOGRAM POST STEREOTACTIC BIOPSY

[R ML synth-2D]
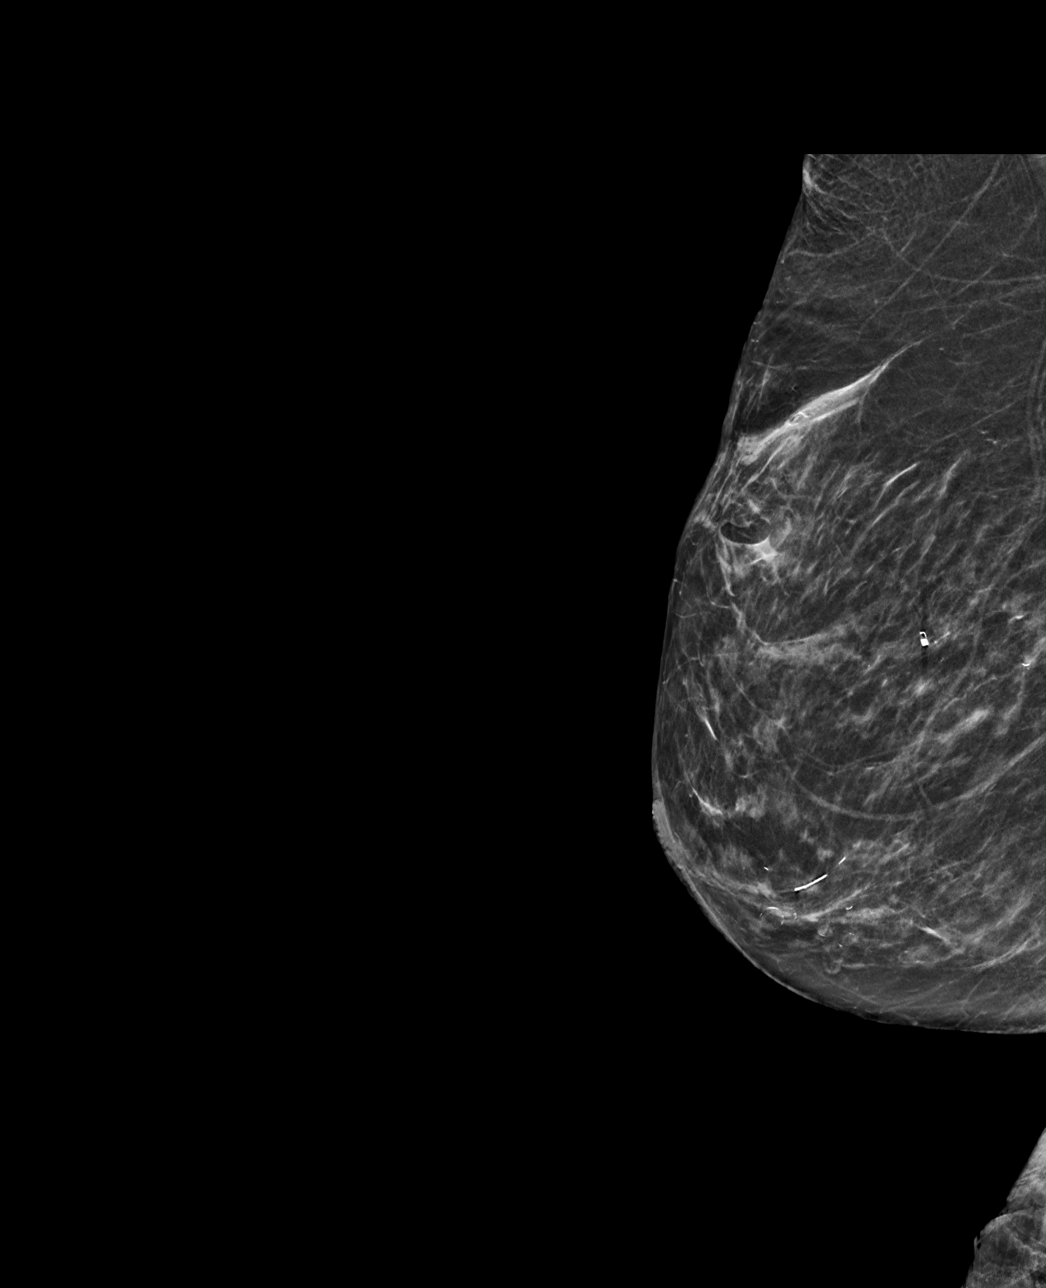

[R CC synth-2D]
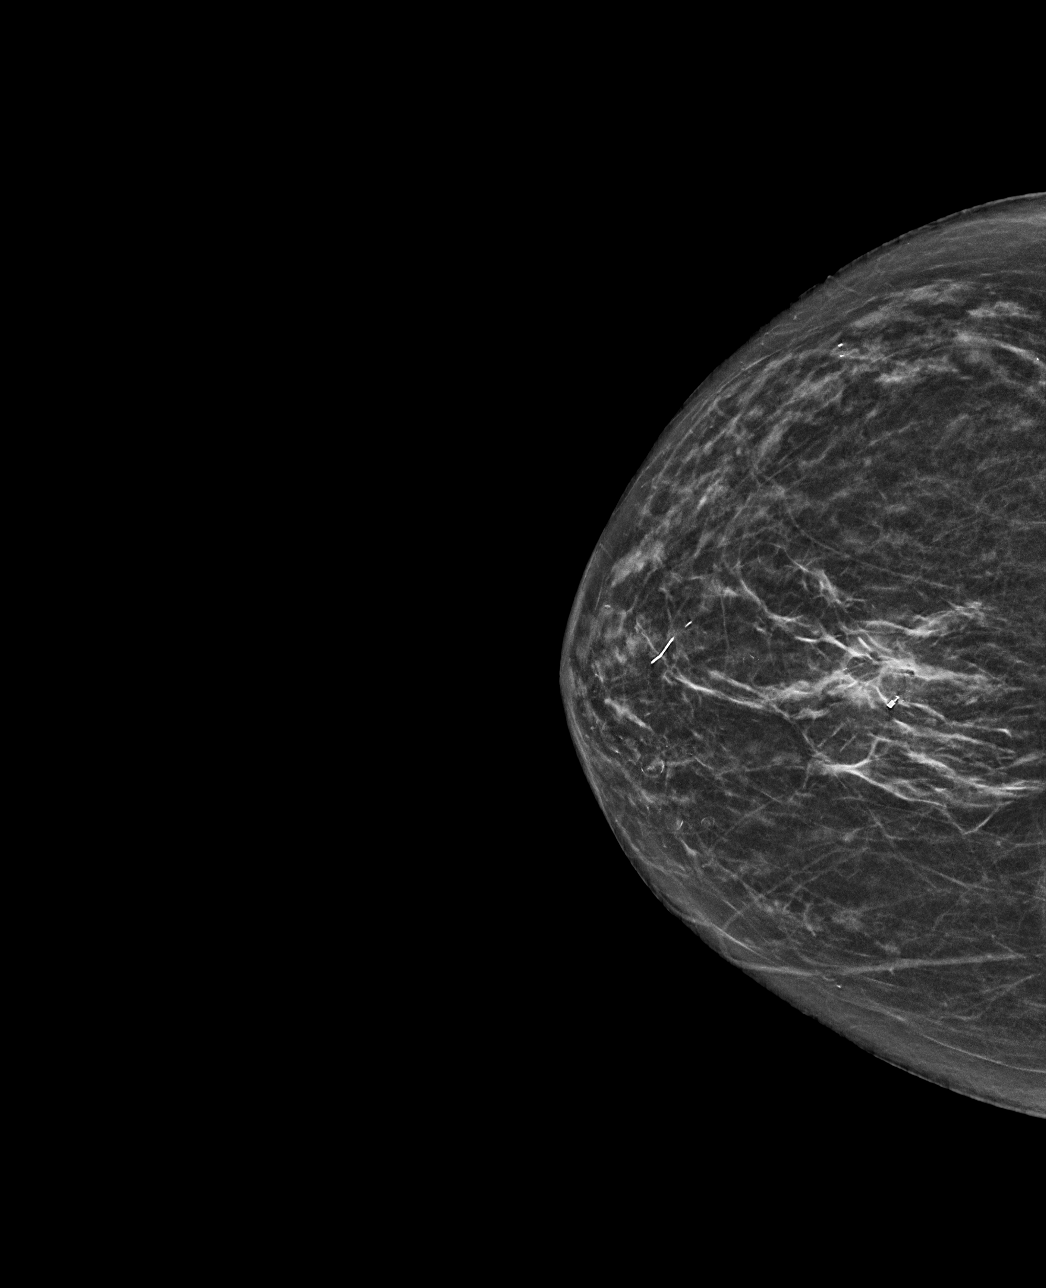

[R ML tomo · tomo slice 29/56.0]
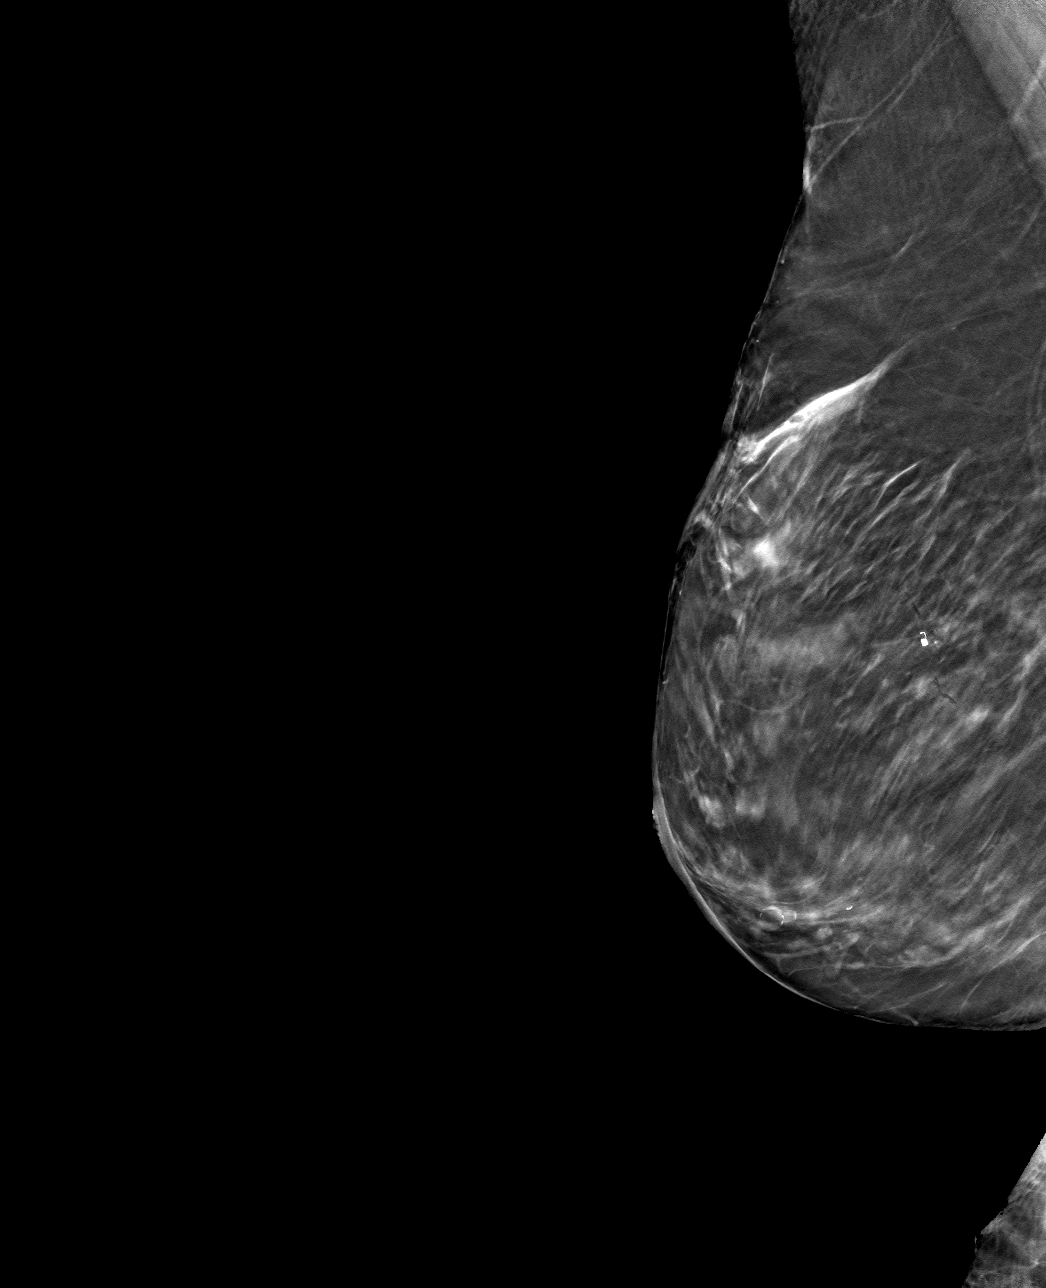

[R CC tomo · tomo slice 24/47.0]
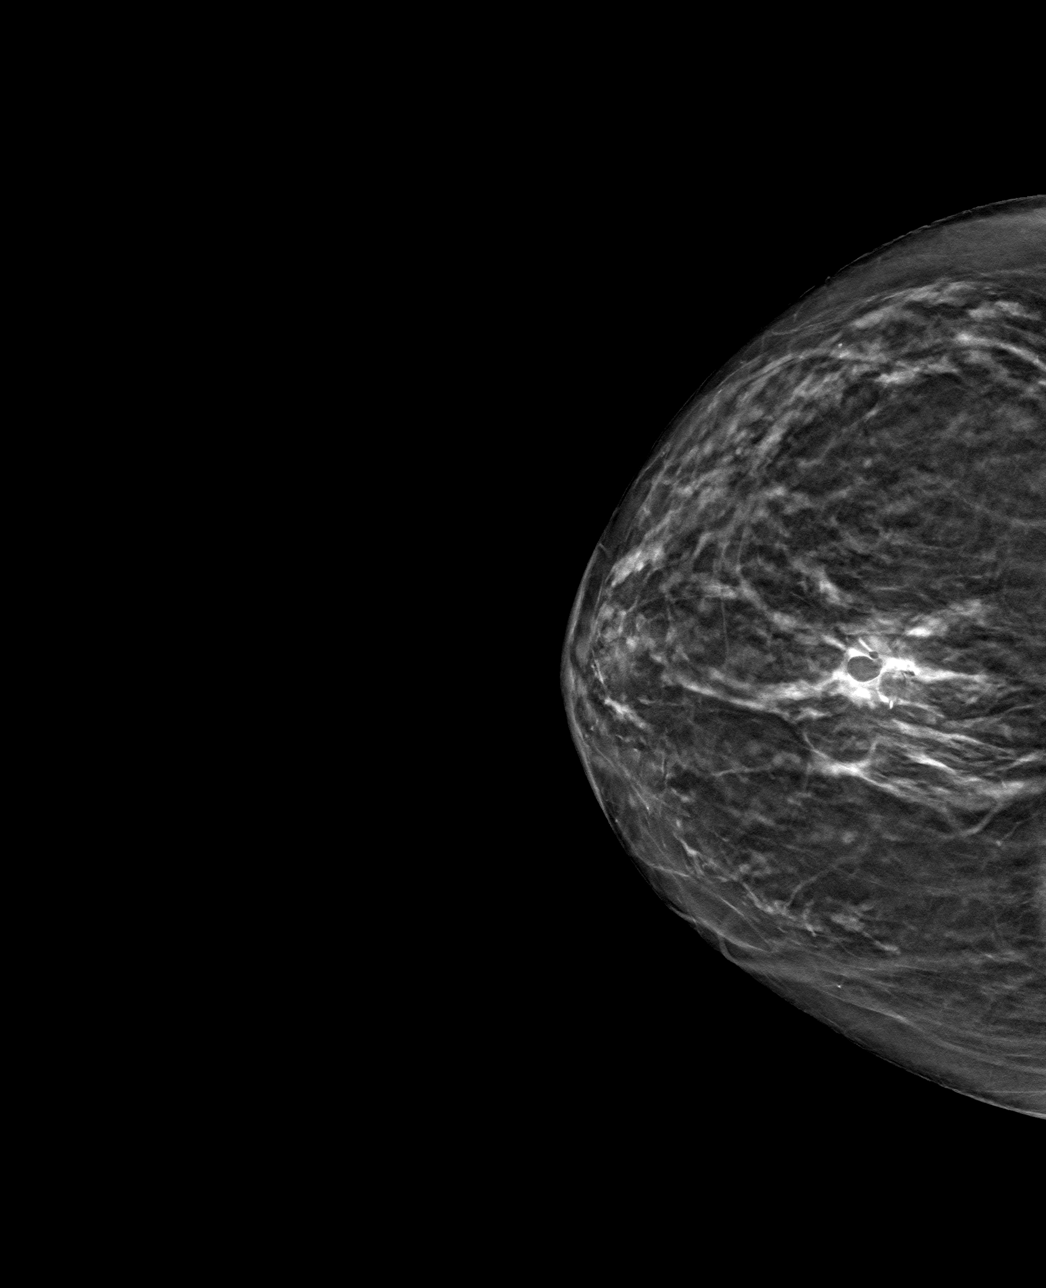

[4 of 12 positions shown; findings below may reference images not displayed]

FINDINGS: Mammographic images were obtained following stereotactic guided
biopsy of the right breast. The biopsy marking clip is in expected
position at the site of biopsy.
IMPRESSION: Appropriate positioning of the coil shaped biopsy marking clip at
the site of biopsy in the central right breast.

Final Assessment: Post Procedure Mammograms for Marker Placement

## 2023-09-03 DEATH — deceased
